# Patient Record
Sex: Male | Born: 1970 | Race: White | Hispanic: No | Marital: Single | Smoking: Current every day smoker
Health system: Southern US, Community
[De-identification: ages and names within clinical notes are randomized; demographics above are authoritative.]

## PROBLEM LIST (undated history)

## (undated) DIAGNOSIS — S0292XA Unspecified fracture of facial bones, initial encounter for closed fracture: Secondary | ICD-10-CM

## (undated) DIAGNOSIS — S022XXA Fracture of nasal bones, initial encounter for closed fracture: Secondary | ICD-10-CM

## (undated) HISTORY — PX: CIRCUMCISION: SUR203

---

## 2020-04-01 ENCOUNTER — Encounter (HOSPITAL_COMMUNITY): Payer: Self-pay

## 2020-04-01 ENCOUNTER — Other Ambulatory Visit: Payer: Self-pay

## 2020-04-01 DIAGNOSIS — S0292XA Unspecified fracture of facial bones, initial encounter for closed fracture: Secondary | ICD-10-CM | POA: Diagnosis not present

## 2020-04-01 DIAGNOSIS — W208XXA Other cause of strike by thrown, projected or falling object, initial encounter: Secondary | ICD-10-CM | POA: Diagnosis not present

## 2020-04-01 DIAGNOSIS — S0993XA Unspecified injury of face, initial encounter: Secondary | ICD-10-CM | POA: Diagnosis present
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
## 2020-04-02 ENCOUNTER — Emergency Department (HOSPITAL_COMMUNITY)
Admission: EM | Admit: 2020-04-02 | Discharge: 2020-04-02 | Disposition: A | Discharge status: Home / Self Care | Payer: Managed Care, Other (non HMO) | Attending: Emergency Medicine | Admitting: Emergency Medicine

## 2020-04-02 ENCOUNTER — Emergency Department (HOSPITAL_COMMUNITY): Payer: Managed Care, Other (non HMO)

## 2020-04-02 DIAGNOSIS — S0292XA Unspecified fracture of facial bones, initial encounter for closed fracture: Secondary | ICD-10-CM

## 2020-04-02 MED ORDER — ACETAMINOPHEN 500 MG PO TABS
1000.0000 mg | ORAL_TABLET | Freq: Once | ORAL | Status: AC
  Administered 2020-04-02: 1000 mg via ORAL
  Filled 2020-04-02: qty 2

## 2020-04-02 MED ORDER — CEPHALEXIN 500 MG PO CAPS
500.0000 mg | ORAL_CAPSULE | Freq: Once | ORAL | Status: AC
  Administered 2020-04-02: 500 mg via ORAL
  Filled 2020-04-02: qty 1

## 2020-04-02 MED ORDER — CEPHALEXIN 500 MG PO CAPS: 500.0000 mg | ORAL_CAPSULE | Freq: Two times a day (BID) | ORAL | 0 refills | Status: AC

## 2020-04-02 MED ORDER — OXYCODONE-ACETAMINOPHEN 5-325 MG PO TABS: 1.0000 | ORAL_TABLET | Freq: Four times a day (QID) | ORAL | 0 refills | Status: AC | PRN

## 2020-04-02 MED ORDER — OXYMETAZOLINE HCL 0.05 % NA SOLN
1.0000 | Freq: Once | NASAL | Status: AC
  Administered 2020-04-02: 1 via NASAL
  Filled 2020-04-02: qty 30
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
## 2020-04-02 NOTE — Discharge Instructions (Addendum)
Please follow-up with the ENT doctor.    Do NOT blow your nose.   CLINICAL DATA:  Facial trauma   EXAM:  CT MAXILLOFACIAL WITHOUT CONTRAST   TECHNIQUE:  Multidetector CT imaging of the maxillofacial structures was  performed. Multiplanar CT image reconstructions were also generated.   COMPARISON:  None.   FINDINGS:  Osseous: There is a nasoorbitoethmoid complex fracture that involves  both nasal bones, the right orbital floor and right lamina  papyracea. There is a sigmoid shaped fracture of the nasal septum  within the nasal cavity. There is a posteriorly displaced fracture  of the anterior wall of the right maxillary sinus. The right frontal  process of the maxilla is fractured.   Orbits: The globes are intact. Normal appearance of the intra- and  extraconal fat. Symmetric extraocular muscles. Right orbital  fractures as above.   Sinuses: No fluid levels or advanced mucosal thickening.   Soft tissues: Normal visualized extracranial soft tissues.   Limited intracranial: Normal.   IMPRESSION:  1. Nasoorbitoethmoid complex fracture with involvement of both nasal  bones, the right orbital floor and right lamina papyracea.  2. Fracture of the right frontal process of the maxilla.  3. Posteriorly displaced fracture of the anterior wall of the right  maxillary sinus.

## 2020-04-08 ENCOUNTER — Other Ambulatory Visit: Payer: Self-pay | Admitting: Otolaryngology

## 2020-04-08 ENCOUNTER — Encounter (HOSPITAL_BASED_OUTPATIENT_CLINIC_OR_DEPARTMENT_OTHER): Payer: Self-pay | Admitting: Otolaryngology

## 2020-04-08 ENCOUNTER — Other Ambulatory Visit: Payer: Self-pay

## 2020-04-10 ENCOUNTER — Other Ambulatory Visit (HOSPITAL_COMMUNITY): Payer: Managed Care, Other (non HMO)

## 2020-04-11 ENCOUNTER — Other Ambulatory Visit (HOSPITAL_COMMUNITY)
Admission: RE | Admit: 2020-04-11 | Discharge: 2020-04-11 | Disposition: A | Discharge status: Home / Self Care | Payer: Managed Care, Other (non HMO) | Source: Ambulatory Visit | Attending: Otolaryngology | Admitting: Otolaryngology

## 2020-04-11 DIAGNOSIS — Z20822 Contact with and (suspected) exposure to covid-19: Secondary | ICD-10-CM | POA: Insufficient documentation

## 2020-04-11 DIAGNOSIS — Z01812 Encounter for preprocedural laboratory examination: Secondary | ICD-10-CM | POA: Insufficient documentation

## 2020-04-11 LAB — SARS CORONAVIRUS 2 (TAT 6-24 HRS): SARS Coronavirus 2: NEGATIVE

## 2020-04-14 ENCOUNTER — Encounter (HOSPITAL_BASED_OUTPATIENT_CLINIC_OR_DEPARTMENT_OTHER)
Admission: RE | Disposition: A | Discharge status: Home / Self Care | Payer: Self-pay | Source: Home / Self Care | Attending: Otolaryngology

## 2020-04-14 ENCOUNTER — Encounter (HOSPITAL_BASED_OUTPATIENT_CLINIC_OR_DEPARTMENT_OTHER): Payer: Self-pay | Admitting: Otolaryngology

## 2020-04-14 ENCOUNTER — Ambulatory Visit (HOSPITAL_BASED_OUTPATIENT_CLINIC_OR_DEPARTMENT_OTHER): Payer: Managed Care, Other (non HMO) | Admitting: Certified Registered"

## 2020-04-14 ENCOUNTER — Other Ambulatory Visit: Payer: Self-pay

## 2020-04-14 ENCOUNTER — Ambulatory Visit (HOSPITAL_BASED_OUTPATIENT_CLINIC_OR_DEPARTMENT_OTHER)
Admission: RE | Admit: 2020-04-14 | Discharge: 2020-04-14 | Disposition: A | Discharge status: Home / Self Care | Payer: Managed Care, Other (non HMO) | Attending: Otolaryngology | Admitting: Otolaryngology

## 2020-04-14 DIAGNOSIS — W1839XA Other fall on same level, initial encounter: Secondary | ICD-10-CM | POA: Diagnosis not present

## 2020-04-14 DIAGNOSIS — F17210 Nicotine dependence, cigarettes, uncomplicated: Secondary | ICD-10-CM | POA: Diagnosis not present

## 2020-04-14 DIAGNOSIS — Z79899 Other long term (current) drug therapy: Secondary | ICD-10-CM | POA: Diagnosis not present

## 2020-04-14 DIAGNOSIS — X58XXXA Exposure to other specified factors, initial encounter: Secondary | ICD-10-CM | POA: Diagnosis not present

## 2020-04-14 DIAGNOSIS — S022XXA Fracture of nasal bones, initial encounter for closed fracture: Secondary | ICD-10-CM | POA: Diagnosis not present

## 2020-04-14 DIAGNOSIS — Z880 Allergy status to penicillin: Secondary | ICD-10-CM | POA: Diagnosis not present

## 2020-04-14 DIAGNOSIS — S0240CA Maxillary fracture, right side, initial encounter for closed fracture: Secondary | ICD-10-CM | POA: Diagnosis not present

## 2020-04-14 DIAGNOSIS — S0285XA Fracture of orbit, unspecified, initial encounter for closed fracture: Secondary | ICD-10-CM | POA: Diagnosis not present

## 2020-04-14 HISTORY — DX: Fracture of nasal bones, initial encounter for closed fracture: S02.2XXA

## 2020-04-14 HISTORY — DX: Unspecified fracture of facial bones, initial encounter for closed fracture: S02.92XA

## 2020-04-14 HISTORY — PX: CLOSED REDUCTION NASAL FRACTURE: SHX5365

## 2020-04-14 HISTORY — PX: ORIF ORBITAL FRACTURE: SHX5312

## 2020-04-14 SURGERY — CLOSED REDUCTION, FRACTURE, NASAL BONE
Anesthesia: General | Site: Nose | Laterality: Right

## 2020-04-14 MED ORDER — OXYCODONE HCL 5 MG PO TABS
5.0000 mg | ORAL_TABLET | Freq: Once | ORAL | Status: AC | PRN
  Administered 2020-04-14: 5 mg via ORAL

## 2020-04-14 MED ORDER — ACETAMINOPHEN 10 MG/ML IV SOLN
INTRAVENOUS | Status: AC
  Filled 2020-04-14: qty 100

## 2020-04-14 MED ORDER — NEOMYCIN-POLYMYXIN-DEXAMETH 3.5-10000-0.1 OP OINT
TOPICAL_OINTMENT | OPHTHALMIC | Status: AC
  Filled 2020-04-14: qty 3.5

## 2020-04-14 MED ORDER — BACITRACIN-NEOMYCIN-POLYMYXIN OINTMENT TUBE
TOPICAL_OINTMENT | CUTANEOUS | Status: AC
  Filled 2020-04-14: qty 14.17

## 2020-04-14 MED ORDER — PROMETHAZINE HCL 25 MG/ML IJ SOLN: 6.2500 mg | INTRAMUSCULAR | Status: DC | PRN

## 2020-04-14 MED ORDER — SUGAMMADEX SODIUM 200 MG/2ML IV SOLN
INTRAVENOUS | Status: DC | PRN
  Administered 2020-04-14: 200 mg via INTRAVENOUS

## 2020-04-14 MED ORDER — OXYCODONE HCL 5 MG PO TABS
ORAL_TABLET | ORAL | Status: AC
  Filled 2020-04-14: qty 1

## 2020-04-14 MED ORDER — AMISULPRIDE (ANTIEMETIC) 5 MG/2ML IV SOLN: 10.0000 mg | Freq: Once | INTRAVENOUS | Status: DC | PRN

## 2020-04-14 MED ORDER — PROPOFOL 10 MG/ML IV BOLUS
INTRAVENOUS | Status: AC
  Filled 2020-04-14: qty 40

## 2020-04-14 MED ORDER — OXYCODONE HCL 5 MG/5ML PO SOLN: 5.0000 mg | Freq: Once | ORAL | Status: AC | PRN

## 2020-04-14 MED ORDER — CEPHALEXIN 500 MG PO CAPS: 500.0000 mg | ORAL_CAPSULE | Freq: Three times a day (TID) | ORAL | 0 refills | Status: AC

## 2020-04-14 MED ORDER — BSS IO SOLN
INTRAOCULAR | Status: DC | PRN
  Administered 2020-04-14: 15 mL

## 2020-04-14 MED ORDER — HYDROMORPHONE HCL 1 MG/ML IJ SOLN
INTRAMUSCULAR | Status: AC
  Filled 2020-04-14: qty 0.5

## 2020-04-14 MED ORDER — ACETAMINOPHEN 10 MG/ML IV SOLN
1000.0000 mg | Freq: Once | INTRAVENOUS | Status: AC
  Administered 2020-04-14: 1000 mg via INTRAVENOUS

## 2020-04-14 MED ORDER — LACTATED RINGERS IV SOLN: INTRAVENOUS | Status: DC

## 2020-04-14 MED ORDER — ROCURONIUM BROMIDE 10 MG/ML (PF) SYRINGE
PREFILLED_SYRINGE | INTRAVENOUS | Status: AC
  Filled 2020-04-14: qty 10

## 2020-04-14 MED ORDER — ARTIFICIAL TEARS OPHTHALMIC OINT
TOPICAL_OINTMENT | OPHTHALMIC | Status: AC
  Filled 2020-04-14: qty 3.5

## 2020-04-14 MED ORDER — OXYCODONE-ACETAMINOPHEN 5-325 MG PO TABS: 1.0000 | ORAL_TABLET | Freq: Four times a day (QID) | ORAL | 0 refills | Status: AC | PRN

## 2020-04-14 MED ORDER — BSS IO SOLN
INTRAOCULAR | Status: AC
  Filled 2020-04-14: qty 15

## 2020-04-14 MED ORDER — LIDOCAINE-EPINEPHRINE 2 %-1:100000 IJ SOLN
INTRAMUSCULAR | Status: AC
  Filled 2020-04-14: qty 1

## 2020-04-14 MED ORDER — FENTANYL CITRATE (PF) 100 MCG/2ML IJ SOLN
INTRAMUSCULAR | Status: AC
  Filled 2020-04-14: qty 2

## 2020-04-14 MED ORDER — HYDRALAZINE HCL 20 MG/ML IJ SOLN
10.0000 mg | Freq: Once | INTRAMUSCULAR | Status: AC
  Administered 2020-04-14: 10 mg via INTRAVENOUS

## 2020-04-14 MED ORDER — ROCURONIUM BROMIDE 100 MG/10ML IV SOLN
INTRAVENOUS | Status: DC | PRN
  Administered 2020-04-14: 100 mg via INTRAVENOUS

## 2020-04-14 MED ORDER — MEPERIDINE HCL 25 MG/ML IJ SOLN: 6.2500 mg | INTRAMUSCULAR | Status: DC | PRN

## 2020-04-14 MED ORDER — LIDOCAINE-EPINEPHRINE 1 %-1:100000 IJ SOLN
INTRAMUSCULAR | Status: DC | PRN
  Administered 2020-04-14: 7 mL

## 2020-04-14 MED ORDER — LIDOCAINE 2% (20 MG/ML) 5 ML SYRINGE
INTRAMUSCULAR | Status: DC | PRN
  Administered 2020-04-14: 100 mg via INTRAVENOUS

## 2020-04-14 MED ORDER — CEFAZOLIN SODIUM-DEXTROSE 2-3 GM-%(50ML) IV SOLR
INTRAVENOUS | Status: DC | PRN
  Administered 2020-04-14: 2 g via INTRAVENOUS

## 2020-04-14 MED ORDER — HYDROMORPHONE HCL 1 MG/ML IJ SOLN
0.2500 mg | INTRAMUSCULAR | Status: DC | PRN
  Administered 2020-04-14 (×3): 0.5 mg via INTRAVENOUS

## 2020-04-14 MED ORDER — ONDANSETRON HCL 4 MG/2ML IJ SOLN
INTRAMUSCULAR | Status: AC
  Filled 2020-04-14: qty 2

## 2020-04-14 MED ORDER — ONDANSETRON HCL 4 MG/2ML IJ SOLN
INTRAMUSCULAR | Status: DC | PRN
  Administered 2020-04-14: 4 mg via INTRAVENOUS

## 2020-04-14 MED ORDER — NEOMYCIN-POLYMYXIN-DEXAMETH 3.5-10000-0.1 OP OINT
TOPICAL_OINTMENT | OPHTHALMIC | Status: DC | PRN
  Administered 2020-04-14: 1

## 2020-04-14 MED ORDER — LIDOCAINE 2% (20 MG/ML) 5 ML SYRINGE
INTRAMUSCULAR | Status: AC
  Filled 2020-04-14: qty 5

## 2020-04-14 MED ORDER — FENTANYL CITRATE (PF) 100 MCG/2ML IJ SOLN
INTRAMUSCULAR | Status: DC | PRN
  Administered 2020-04-14: 100 ug via INTRAVENOUS
  Administered 2020-04-14: 50 ug via INTRAVENOUS

## 2020-04-14 MED ORDER — CEFAZOLIN SODIUM-DEXTROSE 2-4 GM/100ML-% IV SOLN: 2.0000 g | INTRAVENOUS | Status: DC

## 2020-04-14 MED ORDER — PROPOFOL 10 MG/ML IV BOLUS
INTRAVENOUS | Status: DC | PRN
  Administered 2020-04-14: 200 mg via INTRAVENOUS

## 2020-04-14 MED ORDER — DEXAMETHASONE SODIUM PHOSPHATE 10 MG/ML IJ SOLN
INTRAMUSCULAR | Status: DC | PRN
  Administered 2020-04-14: 10 mg via INTRAVENOUS

## 2020-04-14 MED ORDER — MIDAZOLAM HCL 2 MG/2ML IJ SOLN
INTRAMUSCULAR | Status: AC
  Filled 2020-04-14: qty 2

## 2020-04-14 MED ORDER — CEFAZOLIN SODIUM-DEXTROSE 2-4 GM/100ML-% IV SOLN
INTRAVENOUS | Status: AC
  Filled 2020-04-14: qty 100

## 2020-04-14 MED ORDER — ARTIFICIAL TEARS OPHTHALMIC OINT
TOPICAL_OINTMENT | OPHTHALMIC | Status: DC | PRN
  Administered 2020-04-14: 1 via OPHTHALMIC

## 2020-04-14 MED ORDER — HYDRALAZINE HCL 20 MG/ML IJ SOLN
INTRAMUSCULAR | Status: AC
  Filled 2020-04-14: qty 1

## 2020-04-14 MED ORDER — MIDAZOLAM HCL 5 MG/5ML IJ SOLN
INTRAMUSCULAR | Status: DC | PRN
  Administered 2020-04-14: 2 mg via INTRAVENOUS

## 2020-04-14 MED ORDER — OXYMETAZOLINE HCL 0.05 % NA SOLN
NASAL | Status: DC | PRN
  Administered 2020-04-14: 1

## 2020-04-14 MED ORDER — DEXAMETHASONE SODIUM PHOSPHATE 10 MG/ML IJ SOLN
INTRAMUSCULAR | Status: AC
  Filled 2020-04-14: qty 1

## 2020-04-14 SURGICAL SUPPLY — 70 items
APL SKNCLS STERI-STRIP NONHPOA (GAUZE/BANDAGES/DRESSINGS) ×2
APL SRG 3 HI ABS STRL LF PLS (MISCELLANEOUS) ×4
APPLICATOR DR MATTHEWS STRL (MISCELLANEOUS) ×6 IMPLANT
BENZOIN TINCTURE PRP APPL 2/3 (GAUZE/BANDAGES/DRESSINGS) ×3 IMPLANT
BIT DRILL TWIST 1.3X5 (BIT) ×2
BIT DRILL TWIST 1.3X5MM (BIT) ×2 IMPLANT
BIT DRILL UPPR FCE 0.9M 4 TWST (BIT) ×2 IMPLANT
CANISTER SUCT 1200ML W/VALVE (MISCELLANEOUS) ×3 IMPLANT
CLEANER CAUTERY TIP 5X5 PAD (MISCELLANEOUS) IMPLANT
CNTNR URN SCR LID CUP LEK RST (MISCELLANEOUS) IMPLANT
CONT SPEC 4OZ STRL OR WHT (MISCELLANEOUS)
CORD BIPOLAR FORCEPS 12FT (ELECTRODE) IMPLANT
COVER BACK TABLE 60X90IN (DRAPES) ×3 IMPLANT
COVER MAYO STAND STRL (DRAPES) ×3 IMPLANT
DECANTER SPIKE VIAL GLASS SM (MISCELLANEOUS) ×3 IMPLANT
DEPRESSOR TONGUE BLADE STERILE (MISCELLANEOUS) IMPLANT
DRESSING NASAL KENNEDY 3.5X.9 (MISCELLANEOUS) ×2 IMPLANT
DRILL BIT TWIST 1.3X5MM (BIT) ×3
DRILL UPPERFACE 0.9M 4MM TWIST (BIT) ×3
DRSG CURAD 3X16 NADH (PACKING) IMPLANT
DRSG NASAL KENNEDY 3.5X.9 (MISCELLANEOUS) ×3
DRSG TELFA 3X8 NADH (GAUZE/BANDAGES/DRESSINGS) IMPLANT
ELECT NEEDLE BLADE 2-5/6 (NEEDLE) ×3 IMPLANT
ELECT REM PT RETURN 9FT ADLT (ELECTROSURGICAL) ×3
ELECTRODE REM PT RTRN 9FT ADLT (ELECTROSURGICAL) ×2 IMPLANT
GAUZE PACKING IODOFORM 1/2 (PACKING) IMPLANT
GAUZE SPONGE 4X4 12PLY STRL LF (GAUZE/BANDAGES/DRESSINGS) ×3 IMPLANT
GLOVE SURG ENC MOIS LTX SZ6.5 (GLOVE) ×3 IMPLANT
GLOVE SURG ENC MOIS LTX SZ7.5 (GLOVE) ×3 IMPLANT
GOWN STRL REUS W/ TWL LRG LVL3 (GOWN DISPOSABLE) ×4 IMPLANT
GOWN STRL REUS W/TWL LRG LVL3 (GOWN DISPOSABLE) ×6
NEEDLE HYPO 25X1 1.5 SAFETY (NEEDLE) ×3 IMPLANT
NEEDLE PRECISIONGLIDE 27X1.5 (NEEDLE) IMPLANT
NS IRRIG 1000ML POUR BTL (IV SOLUTION) ×3 IMPLANT
PACK BASIN DAY SURGERY FS (CUSTOM PROCEDURE TRAY) ×3 IMPLANT
PACK ENT DAY SURGERY (CUSTOM PROCEDURE TRAY) ×3 IMPLANT
PAD CLEANER CAUTERY TIP 5X5 (MISCELLANEOUS)
PATTIES SURGICAL .5 X3 (DISPOSABLE) ×3 IMPLANT
PENCIL FOOT CONTROL (ELECTRODE) ×3 IMPLANT
PLATE 8 H STRAIGHT UPPER FACE (Plate) ×3 IMPLANT
PLATE MID FACE 4H STRAIGHT (Plate) ×3 IMPLANT
SCREW MIDFACE 1.7X4MM SLF TAP (Screw) ×9 IMPLANT
SCREW UPPERFACE 1.2X4M SLF TAP (Screw) ×12 IMPLANT
SHEET MEDIUM DRAPE 40X70 STRL (DRAPES) IMPLANT
SHEET SILICONE 2X3 0.03 REINF (MISCELLANEOUS) IMPLANT
SHEET SILICONE 2X3 0.04 REINF (MISCELLANEOUS) IMPLANT
SHEILD EYE MED CORNL SHD 22X21 (OPHTHALMIC RELATED) ×3
SHIELD EYE MED CORNL SHD 22X21 (OPHTHALMIC RELATED) ×2 IMPLANT
SLEEVE SCD COMPRESS KNEE MED (STOCKING) ×3 IMPLANT
SPLINT NASAL DENVER LRG BLUSH (MISCELLANEOUS) ×3 IMPLANT
SPONGE GAUZE 2X2 8PLY STRL LF (GAUZE/BANDAGES/DRESSINGS) IMPLANT
STRIP CLOSURE SKIN 1/2X4 (GAUZE/BANDAGES/DRESSINGS) ×3 IMPLANT
STRIP CLOSURE SKIN 1/4X4 (GAUZE/BANDAGES/DRESSINGS) IMPLANT
SUT 6 0 SILK T G140 8DA (SUTURE) ×3 IMPLANT
SUT CHROMIC 4 0 P 3 18 (SUTURE) IMPLANT
SUT ETHIBOND 3-0 V-5 (SUTURE) IMPLANT
SUT ETHILON 3 0 PS 1 (SUTURE) IMPLANT
SUT ETHILON 4 0 P 3 18 (SUTURE) IMPLANT
SUT ETHILON 6 0 P 1 (SUTURE) IMPLANT
SUT MON AB 3-0 SH 27 (SUTURE) ×3
SUT MON AB 3-0 SH27 (SUTURE) ×2 IMPLANT
SUT PLAIN 6 0 TG1408 (SUTURE) ×3 IMPLANT
SUT SILK 4 0 P 3 (SUTURE) ×3 IMPLANT
SUT SILK 6 0 P 1 (SUTURE) ×6 IMPLANT
SYR BULB EAR ULCER 3OZ GRN STR (SYRINGE) ×6 IMPLANT
SYR CONTROL 10ML LL (SYRINGE) ×3 IMPLANT
TOWEL GREEN STERILE FF (TOWEL DISPOSABLE) ×3 IMPLANT
TRAY DSU PREP LF (CUSTOM PROCEDURE TRAY) ×3 IMPLANT
TUBE CONNECTING 20X1/4 (TUBING) ×3 IMPLANT
YANKAUER SUCT BULB TIP NO VENT (SUCTIONS) ×3 IMPLANT

## 2020-04-14 NOTE — Op Note (Signed)
Preop diagnosis: Right orbital and nasal fractures Postop diagnosis: same Procedure: Open reduction, internal fixation right orbital and maxillary fracture and closed nasal reduction Surgeon: Jenne Pane Assist: None Anesth: General and local with 1% lidocaine with 1:100,000 epinephrine Compl: None Findings: Right nasal bones depressed.  Right medial buttress and medial inferior orbital rim depressed.  Orbital rim reduced and fixated.  Medial buttress reduced and partially fixated.  Description:  After discussing risks, benefits, and alternatives, the patient was brought to the operative suite and placed on the operative table in the supine position.  Anesthesia was induced and the patient was intubated by the Anesthesia team without difficulty.  The eyes were lubricated and the face was prepped and draped in sterile fashion.  Intravenous antibiotic was given.  Afrin-soaked pledgets were placed in both nasal passages.  The right superior gingivobuccal sulcus and right lateral canthus and lower lid conjunctiva were injected with local anesthetic.  The Afrin pledgets were removed.  The nasal fracture was then reduced using an elevator and bimanual manipulation.  The inferior orbital rim step-off could not be reduced this way.  The superior gingivobuccal incision was made with electrocautery through the mucosa and then through the submucosal tissues onto the maxilla.  Soft tissues were elevated exposing the maxilla.  The infraorbital nerve was identified and kept intact.  The medial buttress fractures were visualized.  The lateral canthus was then crushed with a hemostat and incised using scissors.  The lower limb of the lateral canthal tendon was then divided with the scissors.  The lower lid conjunctiva was incised along the lower edge of the tarsal plate and stay sutures were placed.  Blunt dissection was then performed deep to the orbicularis oculi muscle down to the orbital rim.  Electrocautery was then  used to incise onto the rim and soft tissues were swept free.  The rim step-off was identified and the medial bone segment exposed.  The medial segment was then reduced into position using a Coker clamp.  A 5 hole Leibinger 1.2 mm plate was then bent and placed across the fracture, drilling holes and placing four 4 mm screws.  The medial buttress was then examined and manipulated to allow better bony positioning.  A lower fragment along the nasal aperture was then secured to inferior stable bone using a 4 hole Leibinger 1.7 mm plate and three 4 mm screws.  This helped establish the more normal contour of the medial buttress.  The wounds were copiously irrigated with saline.  The oral incision was closed with 3-0 Monocryl in a simple, running fashion.  The lower lid conjunctiva was closed with 6-0 plain gut in a simple, running fashion.  The lateral canthal tendon was reconstituted using a 4-0 silk suture.  The lateral canthal skin was closed with 6-0 silk suture.  Triple antibiotic ointment was added to the right eye.  The nose was again reduced using the elevator and bimanual manipulation.  A small Merocel pack was placed in the right side to hold the nasal bone out.  It was saturated with saline.  The external nose was cleaned off and treated with Benzoin.  Custom-cut Steri-strips were then placed.  The thermoplast splint was trimmed to fit the nose and then placed in hot water until malleable.  It was then laid over the nose and allowed to harden in place.  The throat was suctioned and the patient was returned to anesthesia for wake-up, was extubated, and moved to the recovery room in stable condition.

## 2020-04-14 NOTE — Brief Op Note (Signed)
04/14/2020  2:31 PM  PATIENT:  Dan Shaw  50 y.o. male  PRE-OPERATIVE DIAGNOSIS:  Nasal Fracture, facial Fracture  POST-OPERATIVE DIAGNOSIS:  Nasal Fracture, facial Fracture  PROCEDURE:  Procedure(s): CLOSED REDUCTION NASAL FRACTURE (N/A) OPEN REDUCTION INTERNAL FIXATION ORBITAL FRACTURE (Right)  SURGEON:  Surgeon(s) and Role:    Christia Reading, MD - Primary  PHYSICIAN ASSISTANT:   ASSISTANTS: none   ANESTHESIA:   general  EBL:  25 cc  BLOOD ADMINISTERED:none  DRAINS: none   LOCAL MEDICATIONS USED:  LIDOCAINE   SPECIMEN:  No Specimen  DISPOSITION OF SPECIMEN:  N/A  COUNTS:  YES  TOURNIQUET:  * No tourniquets in log *  DICTATION: .Note written in EPIC  PLAN OF CARE: Discharge to home after PACU  PATIENT DISPOSITION:  PACU - hemodynamically stable.   Delay start of Pharmacological VTE agent (>24hrs) due to surgical blood loss or risk of bleeding: no

## 2020-04-14 NOTE — Anesthesia Preprocedure Evaluation (Signed)
Anesthesia Evaluation  Patient identified by MRN, date of birth, ID band Patient awake    Reviewed: Allergy & Precautions, NPO status , Patient's Chart, lab work & pertinent test results  Airway Mallampati: II  TM Distance: >3 FB Neck ROM: Full    Dental no notable dental hx.    Pulmonary neg pulmonary ROS, Current Smoker and Patient abstained from smoking.,    Pulmonary exam normal breath sounds clear to auscultation       Cardiovascular negative cardio ROS Normal cardiovascular exam Rhythm:Regular Rate:Normal     Neuro/Psych negative neurological ROS  negative psych ROS   GI/Hepatic negative GI ROS, Neg liver ROS,   Endo/Other  negative endocrine ROS  Renal/GU negative Renal ROS  negative genitourinary   Musculoskeletal negative musculoskeletal ROS (+)   Abdominal (+) + obese,   Peds negative pediatric ROS (+)  Hematology negative hematology ROS (+)   Anesthesia Other Findings   Reproductive/Obstetrics negative OB ROS                             Anesthesia Physical Anesthesia Plan  ASA: II  Anesthesia Plan: General   Post-op Pain Management:    Induction: Intravenous  PONV Risk Score and Plan: 1 and Ondansetron and Treatment may vary due to age or medical condition  Airway Management Planned: LMA and Oral ETT  Additional Equipment:   Intra-op Plan:   Post-operative Plan: Extubation in OR  Informed Consent: I have reviewed the patients History and Physical, chart, labs and discussed the procedure including the risks, benefits and alternatives for the proposed anesthesia with the patient or authorized representative who has indicated his/her understanding and acceptance.     Dental advisory given  Plan Discussed with: CRNA  Anesthesia Plan Comments:         Anesthesia Quick Evaluation

## 2020-04-14 NOTE — Transfer of Care (Signed)
Immediate Anesthesia Transfer of Care Note  Patient: Dan Shaw  Procedure(s) Performed: CLOSED REDUCTION NASAL FRACTURE (N/A Nose) OPEN REDUCTION INTERNAL FIXATION ORBITAL FRACTURE (Right Face)  Patient Location: PACU  Anesthesia Type:General  Level of Consciousness: drowsy  Airway & Oxygen Therapy: Patient Spontanous Breathing and Patient connected to face mask oxygen  Post-op Assessment: Report given to RN and Post -op Vital signs reviewed and stable  Post vital signs: Reviewed and stable  Last Vitals:  Vitals Value Taken Time  BP 173/122 04/14/20 1438  Temp    Pulse 70 04/14/20 1440  Resp 12 04/14/20 1440  SpO2 98 % 04/14/20 1440  Vitals shown include unvalidated device data.  Last Pain:  Vitals:   04/14/20 1112  TempSrc: Oral  PainSc: 0-No pain         Complications: No complications documented.

## 2020-04-14 NOTE — Discharge Instructions (Signed)
Apply antibiotic ointment in right eye four times per day.   Post Anesthesia Home Care Instructions  Activity: Get plenty of rest for the remainder of the day. A responsible individual must stay with you for 24 hours following the procedure.  For the next 24 hours, DO NOT: -Drive a car -Advertising copywriter -Drink alcoholic beverages -Take any medication unless instructed by your physician -Make any legal decisions or sign important papers.  Meals: Start with liquid foods such as gelatin or soup. Progress to regular foods as tolerated. Avoid greasy, spicy, heavy foods. If nausea and/or vomiting occur, drink only clear liquids until the nausea and/or vomiting subsides. Call your physician if vomiting continues.  Special Instructions/Symptoms: Your throat may feel dry or sore from the anesthesia or the breathing tube placed in your throat during surgery. If this causes discomfort, gargle with warm salt water. The discomfort should disappear within 24 hours.  If you had a scopolamine patch placed behind your ear for the management of post- operative nausea and/or vomiting:  1. The medication in the patch is effective for 72 hours, after which it should be removed.  Wrap patch in a tissue and discard in the trash. Wash hands thoroughly with soap and water. 2. You may remove the patch earlier than 72 hours if you experience unpleasant side effects which may include dry mouth, dizziness or visual disturbances. 3. Avoid touching the patch. Wash your hands with soap and water after contact with the patch.  No tylenol until after 9:15pm if needed.

## 2020-04-14 NOTE — Anesthesia Procedure Notes (Signed)
Procedure Name: Intubation Date/Time: 04/14/2020 12:55 PM Performed by: Lavonia Dana, CRNA Pre-anesthesia Checklist: Patient identified, Emergency Drugs available, Suction available and Patient being monitored Patient Re-evaluated:Patient Re-evaluated prior to induction Oxygen Delivery Method: Circle system utilized Preoxygenation: Pre-oxygenation with 100% oxygen Induction Type: IV induction Ventilation: Mask ventilation without difficulty and Oral airway inserted - appropriate to patient size Laryngoscope Size: Mac and 4 Grade View: Grade I Tube type: Oral Tube size: 7.5 mm Number of attempts: 1 Airway Equipment and Method: Stylet and Oral airway Placement Confirmation: ETT inserted through vocal cords under direct vision,  positive ETCO2 and breath sounds checked- equal and bilateral Secured at: 22 cm Tube secured with: Tape Dental Injury: Teeth and Oropharynx as per pre-operative assessment
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
## 2020-04-15 ENCOUNTER — Encounter (HOSPITAL_BASED_OUTPATIENT_CLINIC_OR_DEPARTMENT_OTHER): Payer: Self-pay | Admitting: Otolaryngology

## 2020-04-15 NOTE — Anesthesia Postprocedure Evaluation (Signed)
Anesthesia Post Note  Patient: Acen Craun  Procedure(s) Performed: CLOSED REDUCTION NASAL FRACTURE (N/A Nose) OPEN REDUCTION INTERNAL FIXATION ORBITAL FRACTURE (Right Face)     Patient location during evaluation: PACU Anesthesia Type: General Level of consciousness: awake and alert Pain management: pain level controlled Vital Signs Assessment: post-procedure vital signs reviewed and stable Respiratory status: spontaneous breathing, nonlabored ventilation, respiratory function stable and patient connected to nasal cannula oxygen Cardiovascular status: blood pressure returned to baseline and stable Postop Assessment: no apparent nausea or vomiting Anesthetic complications: no   No complications documented.  Last Vitals:  Vitals:   04/14/20 1553 04/14/20 1600  BP:  (!) 196/95  Pulse: 70 73  Resp: 15 20  Temp:  36.8 C  SpO2: 94% 97%    Last Pain:  Vitals:   04/14/20 1600  TempSrc:   PainSc: 3                  Talisha Erby

## 2021-08-19 IMAGING — CT CT MAXILLOFACIAL W/O CM
3 series · 16 of 47 positions shown, 19 images · non-contrast
Comparison: None.

CLINICAL DATA: Facial trauma

EXAM:
CT MAXILLOFACIAL WITHOUT CONTRAST
TECHNIQUE: Multidetector CT imaging of the maxillofacial structures was
performed. Multiplanar CT image reconstructions were also generated.

[Series 3: max soft · axial · 0.33mm/px · z∈[+1390,+1546]mm · 10 of 92 slices shown, 13 images]
[im 7/92  brain]
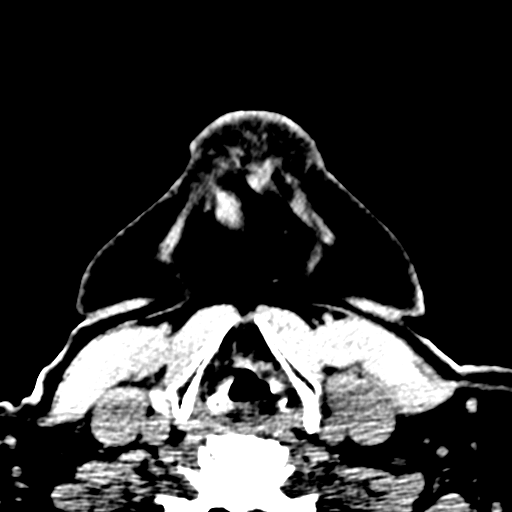
[im 7/92  bone]
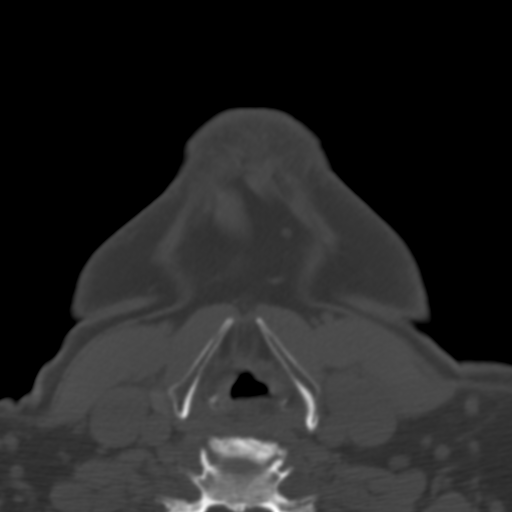
[im 16/92  bone]
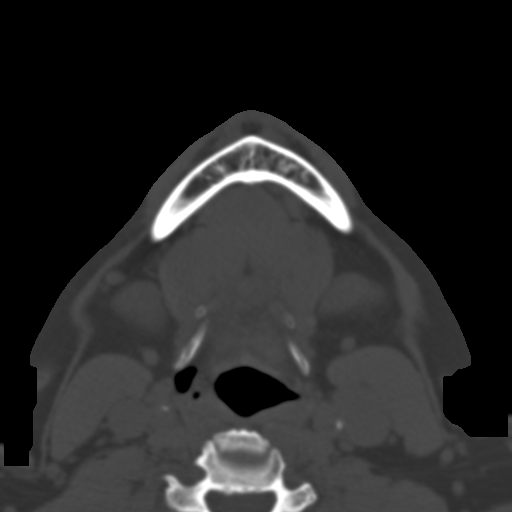
[im 26/92  bone]
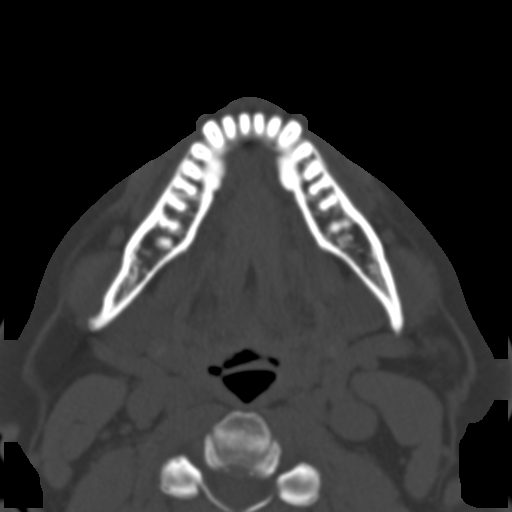
[im 32/92  bone]
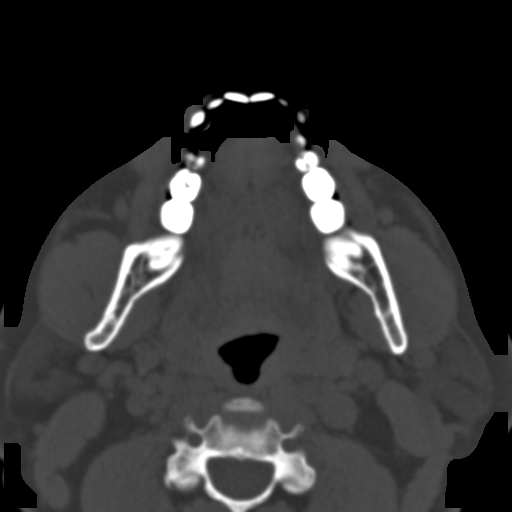
[im 41/92  brain]
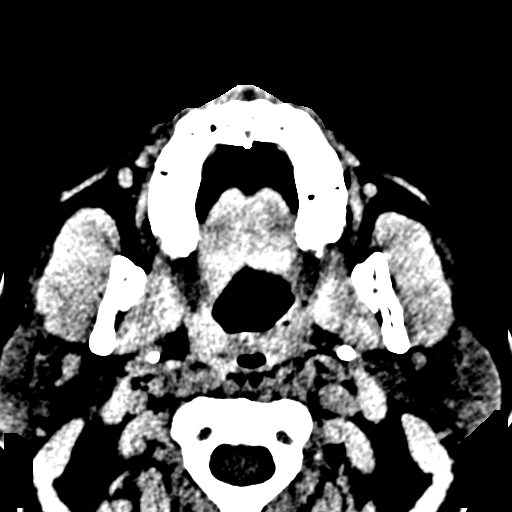
[im 41/92  bone]
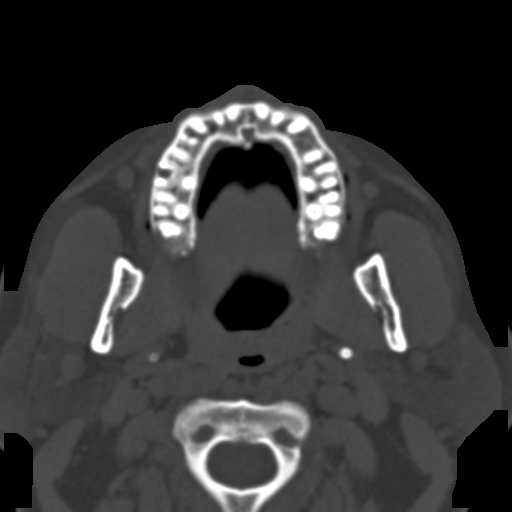
[im 51/92  bone]
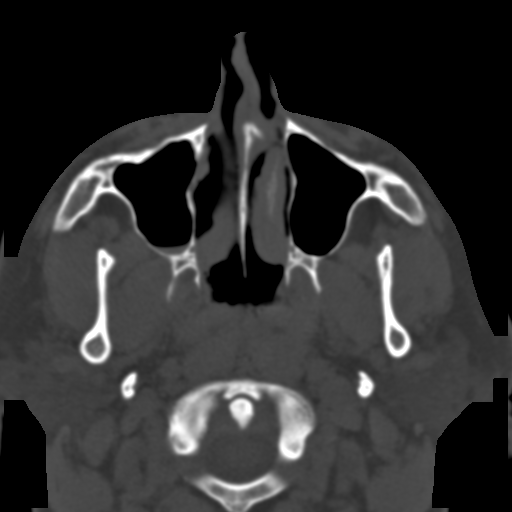
[im 60/92  bone]
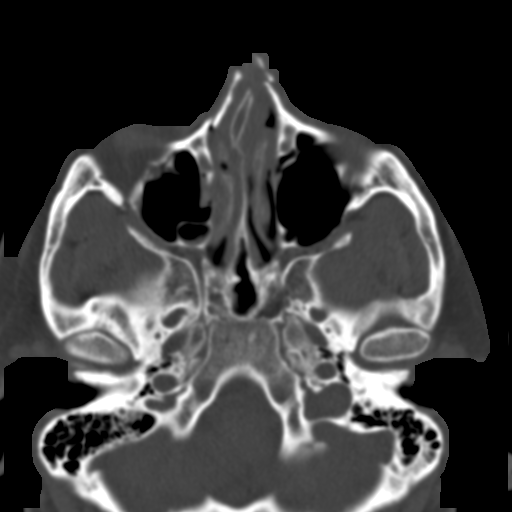
[im 70/92  bone]
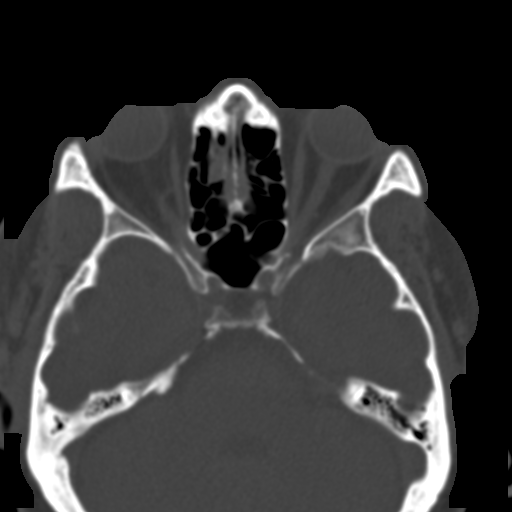
[im 76/92  brain]
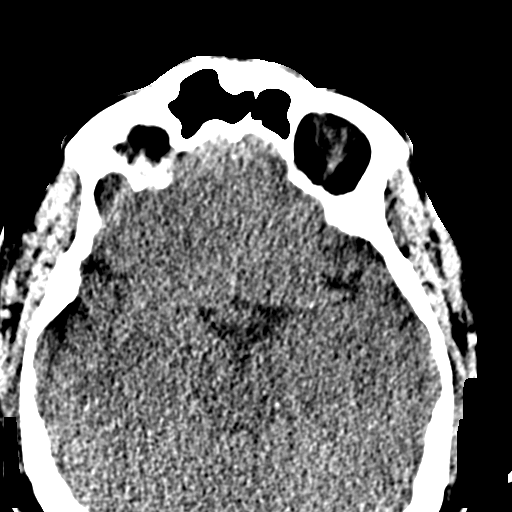
[im 76/92  bone]
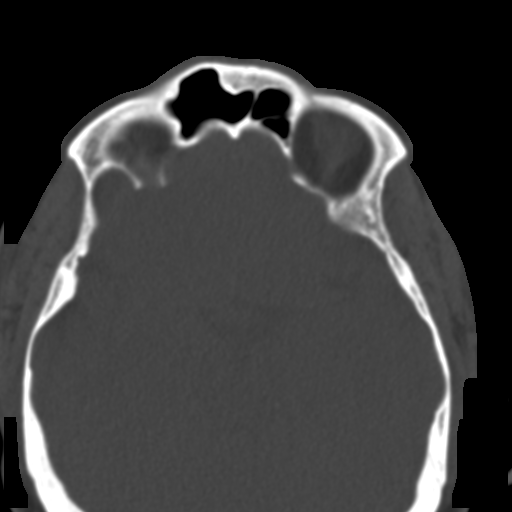
[im 85/92  bone]
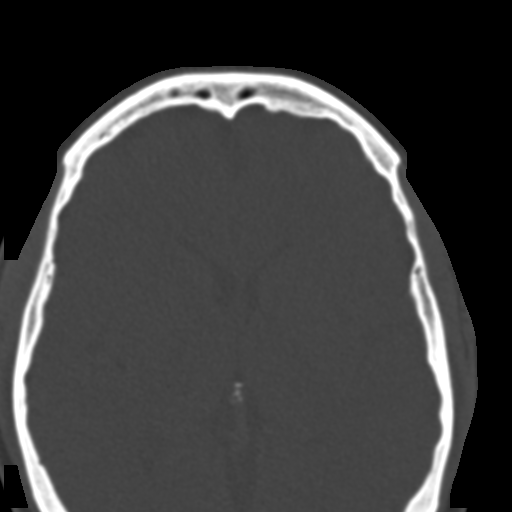

[Series 5: coronal soft · coronal · 0.34mm/px · 3 of 79 slices shown]
[im 27/79  bone]
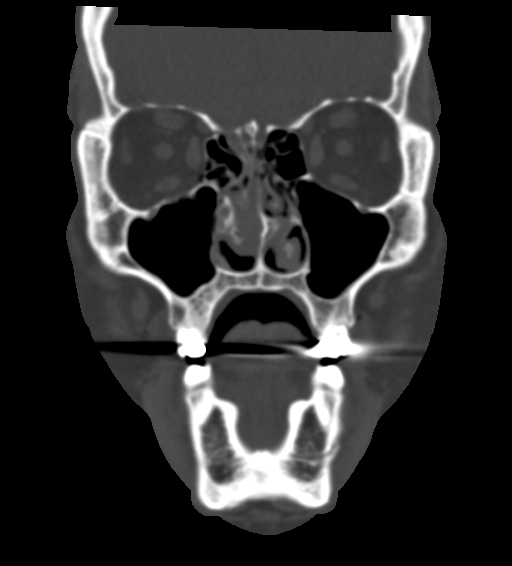
[im 35/79  bone]
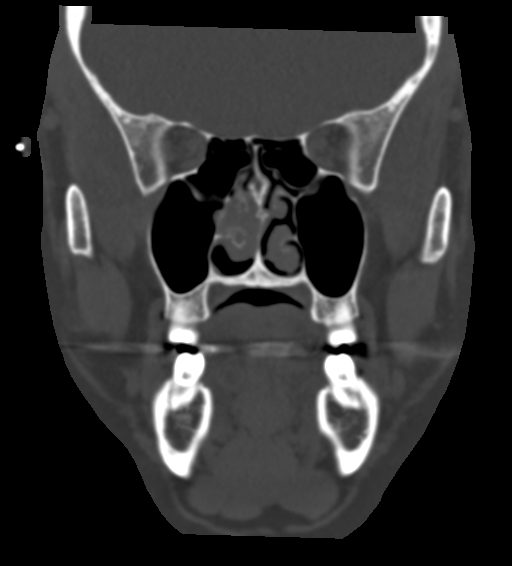
[im 44/79  bone]
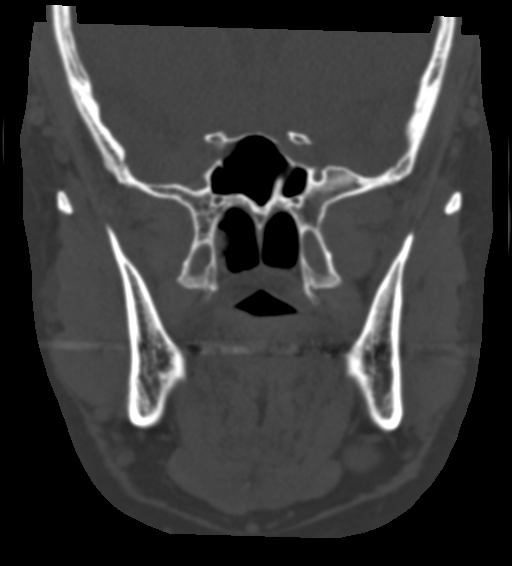

[Series 6: sagittal soft · sagittal · 0.34mm/px · 3 of 89 slices shown]
[im 30/89  bone]
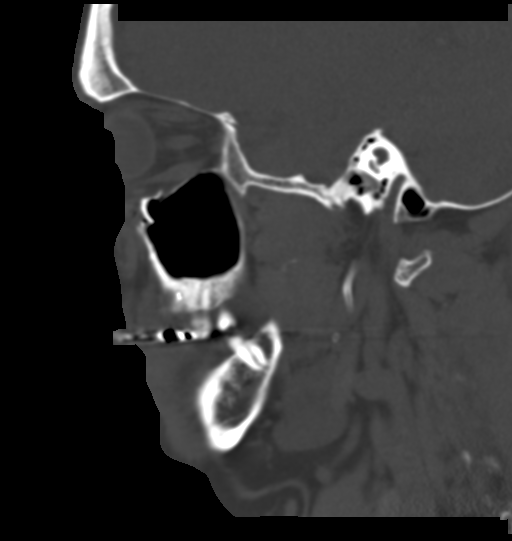
[im 45/89  bone]
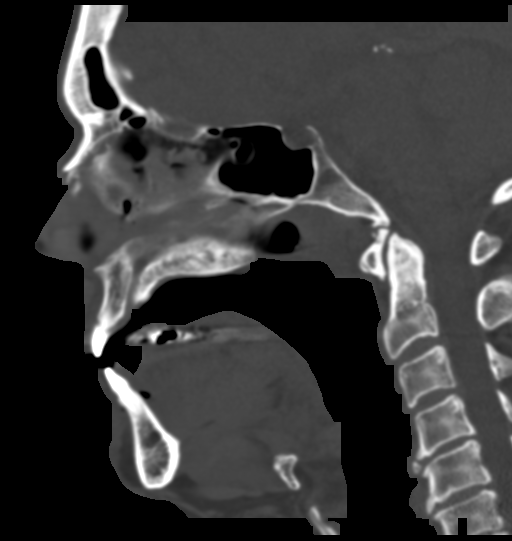
[im 59/89  bone]
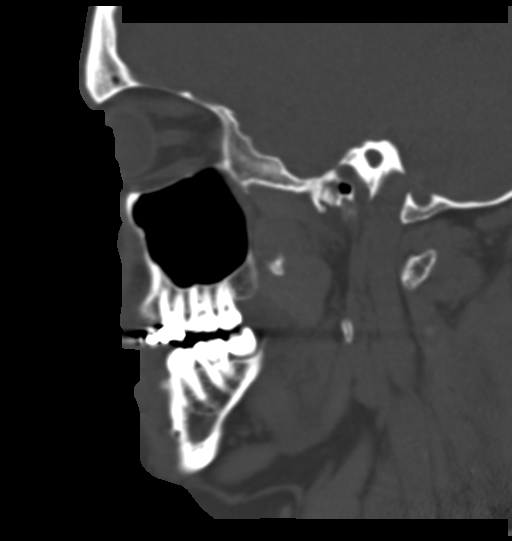

[16 of 47 positions shown; findings below may reference images not displayed]

FINDINGS: Osseous: There is a nasoorbitoethmoid complex fracture that involves
both nasal bones, the right orbital floor and right lamina
papyracea. There is a sigmoid shaped fracture of the nasal septum
within the nasal cavity. There is a posteriorly displaced fracture
of the anterior wall of the right maxillary sinus. The right frontal
process of the maxilla is fractured.

Orbits: The globes are intact. Normal appearance of the intra- and
extraconal fat. Symmetric extraocular muscles. Right orbital
fractures as above.

Sinuses: No fluid levels or advanced mucosal thickening.

Soft tissues: Normal visualized extracranial soft tissues.

Limited intracranial: Normal.
IMPRESSION: 1. Nasoorbitoethmoid complex fracture with involvement of both nasal
bones, the right orbital floor and right lamina papyracea.
2. Fracture of the right frontal process of the maxilla.
3. Posteriorly displaced fracture of the anterior wall of the right
maxillary sinus.
